# Patient Record
Sex: Female | Born: 1995 | Race: Black or African American | Hispanic: No | Marital: Single | Smoking: Current every day smoker
Health system: Southern US, Community
[De-identification: ages and names within clinical notes are randomized; demographics above are authoritative.]

## PROBLEM LIST (undated history)

## (undated) DIAGNOSIS — M419 Scoliosis, unspecified: Secondary | ICD-10-CM

## (undated) DIAGNOSIS — D571 Sickle-cell disease without crisis: Secondary | ICD-10-CM

## (undated) HISTORY — DX: Scoliosis, unspecified: M41.9

## (undated) HISTORY — DX: Sickle-cell disease without crisis: D57.1

## (undated) HISTORY — PX: WISDOM TOOTH EXTRACTION: SHX21

---
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
## 2016-06-19 ENCOUNTER — Ambulatory Visit (INDEPENDENT_AMBULATORY_CARE_PROVIDER_SITE_OTHER): Payer: Medicaid Other | Admitting: Family Medicine

## 2016-06-19 ENCOUNTER — Encounter: Payer: Self-pay | Admitting: Family Medicine

## 2016-06-19 VITALS — BP 102/60 | HR 70 | Temp 97.9°F | Resp 16 | Ht 67.5 in | Wt 100.8 lb

## 2016-06-19 DIAGNOSIS — R634 Abnormal weight loss: Secondary | ICD-10-CM | POA: Diagnosis not present

## 2016-06-19 DIAGNOSIS — Z720 Tobacco use: Secondary | ICD-10-CM

## 2016-06-19 DIAGNOSIS — N912 Amenorrhea, unspecified: Secondary | ICD-10-CM

## 2016-06-19 DIAGNOSIS — Z0000 Encounter for general adult medical examination without abnormal findings: Secondary | ICD-10-CM

## 2016-06-19 DIAGNOSIS — Z0001 Encounter for general adult medical examination with abnormal findings: Secondary | ICD-10-CM | POA: Diagnosis not present

## 2016-06-19 DIAGNOSIS — Z23 Encounter for immunization: Secondary | ICD-10-CM

## 2016-06-19 DIAGNOSIS — Z113 Encounter for screening for infections with a predominantly sexual mode of transmission: Secondary | ICD-10-CM | POA: Diagnosis not present

## 2016-06-19 LAB — COMPREHENSIVE METABOLIC PANEL
ALT: 9 U/L (ref 6–29)
AST: 18 U/L (ref 10–30)
Albumin: 4.2 g/dL (ref 3.6–5.1)
Alkaline Phosphatase: 60 U/L (ref 33–115)
BUN: 8 mg/dL (ref 7–25)
CHLORIDE: 106 mmol/L (ref 98–110)
CO2: 22 mmol/L (ref 20–31)
Calcium: 8.9 mg/dL (ref 8.6–10.2)
Creat: 0.74 mg/dL (ref 0.50–1.10)
Glucose, Bld: 91 mg/dL (ref 70–99)
POTASSIUM: 4.5 mmol/L (ref 3.5–5.3)
Sodium: 140 mmol/L (ref 135–146)
TOTAL PROTEIN: 6.5 g/dL (ref 6.1–8.1)
Total Bilirubin: 0.3 mg/dL (ref 0.2–1.2)

## 2016-06-19 LAB — CBC WITH DIFFERENTIAL/PLATELET
BASOS ABS: 0 {cells}/uL (ref 0–200)
Basophils Relative: 0 %
EOS ABS: 94 {cells}/uL (ref 15–500)
EOS PCT: 2 %
HCT: 37.2 % (ref 35.0–45.0)
Hemoglobin: 12.1 g/dL (ref 12.0–15.0)
LYMPHS PCT: 42 %
Lymphs Abs: 1974 cells/uL (ref 850–3900)
MCH: 27.5 pg (ref 27.0–33.0)
MCHC: 32.5 g/dL (ref 32.0–36.0)
MCV: 84.5 fL (ref 80.0–100.0)
MONOS PCT: 9 %
MPV: 9.3 fL (ref 7.5–12.5)
Monocytes Absolute: 423 cells/uL (ref 200–950)
NEUTROS PCT: 47 %
Neutro Abs: 2209 cells/uL (ref 1500–7800)
PLATELETS: 236 10*3/uL (ref 140–400)
RBC: 4.4 MIL/uL (ref 3.80–5.10)
RDW: 14.8 % (ref 11.0–15.0)
WBC: 4.7 10*3/uL (ref 3.8–10.8)

## 2016-06-19 LAB — URINALYSIS, ROUTINE W REFLEX MICROSCOPIC
BILIRUBIN URINE: NEGATIVE
Glucose, UA: NEGATIVE
Hgb urine dipstick: NEGATIVE
Ketones, ur: NEGATIVE
LEUKOCYTES UA: NEGATIVE
NITRITE: NEGATIVE
Protein, ur: NEGATIVE
SPECIFIC GRAVITY, URINE: 1.03 (ref 1.001–1.035)
pH: 6 (ref 5.0–8.0)

## 2016-06-19 LAB — PREGNANCY, URINE: PREG TEST UR: NEGATIVE

## 2016-06-19 NOTE — Patient Instructions (Signed)
F/U 4 weeks Planning to restart REMERON at bedtime for appetite

## 2016-06-19 NOTE — Addendum Note (Signed)
Addended by: Phillips OdorSIX, Maleke Feria H on: 06/19/2016 10:22 AM   Modules accepted: Orders

## 2016-06-19 NOTE — Progress Notes (Signed)
   Subjective:    Patient ID: Bridget Wolf, female    DOB: 07/28/1995, 21 y.o.   MRN: [id number]  Patient presents for CPE (is fasting)  Pt here for CPE, last seen in August 2016.  Sexual activity- Female partners Immunizations reviewed due for Gardisil/Men B Weight down 8lbs since last visit. States her appetite has decreased over the past couple months. She knows that she has been losing weight she's not had a regular menstrual cycle the past 2 months. She denies that she could be pregnant. She admits to some stress but overall feels like she is happy. 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 Sleeps well, but admits to smoking  Tobacco and marijauana in the evenings.  Denies abdominal pain, or change in bowels/urine pattern, no vaginal discharge, no nausea or pain with eating Often does not eat until 1pm, gets up late, and may or may not eat that night.     Review Of Systems:  GEN- denies fatigue, fever, +weight loss,weakness, recent illness HEENT- denies eye drainage, change in vision, nasal discharge, CVS- denies chest pain, palpitations RESP- denies SOB, cough, wheeze ABD- denies N/V, change in stools, abd pain GU- denies dysuria, hematuria, dribbling, incontinence MSK- denies joint pain, muscle aches, injury Neuro- denies headache, dizziness, syncope, seizure activity       Objective:    BP 102/60   Pulse 70   Temp 97.9 F (36.6 C) (Oral)   Resp 16   Ht 5' 7.5" (1.715 m)   Wt 100 lb 12.8 oz (45.7 kg)   LMP 05/03/2016 (Approximate) Comment: irregular  SpO2 99%   BMI 15.55 kg/m  GEN- NAD, alert and oriented x3, thin  HEENT- PERRL, EOMI, non injected sclera, pink conjunctiva, MMM, oropharynx clear Neck- Supple, no thyromegaly CVS- RRR, no murmur RESP-CTAB ABD-NABS,soft,NT,ND Psych- normal affect and mood  EXT- No edema Pulses- Radial, DP- 2+        Assessment & Plan:      Problem List Items Addressed This Visit    None    Visit Diagnoses    Routine general medical examination at a health care facility    -  Primary   CPE done, immunizations Men B and gardisil given. Check labs for metabolic cause of her weight loss. STD check done. If neg, will try remeron. May be more stress related that she may not be letting on. Also dicussed use of tobacco and marijuana Start ensure this weekend for morning supplement RTC in 4 weeks   Relevant Orders   CBC with Differential/Platelet   Comprehensive metabolic panel   Weight loss       Relevant Orders   TSH   Amenorrhea       Check U preg as child bearing age as well as FSH/LH   Relevant Orders   Pregnancy, urine   FSH/LH   Screen for STD (sexually transmitted disease)       Relevant Orders   GC/Chlamydia Probe Amp   Urinalysis, Routine w reflex microscopic   HIV antibody   HSV(herpes simplex vrs) 1+2 ab-IgG   Tobacco use          Note: This dictation was prepared with Dragon dictation along with smaller phrase technology. Any transcriptional errors that result from this process are unintentional.

## 2016-06-20 LAB — HIV ANTIBODY (ROUTINE TESTING W REFLEX): HIV: NONREACTIVE

## 2016-06-20 LAB — GC/CHLAMYDIA PROBE AMP
CT Probe RNA: NOT DETECTED
GC Probe RNA: NOT DETECTED

## 2016-06-20 LAB — FSH/LH
FSH: 3.5 m[IU]/mL
LH: 7.2 m[IU]/mL

## 2016-06-20 LAB — TSH: TSH: 0.88 mIU/L

## 2016-06-22 LAB — HSV(HERPES SIMPLEX VRS) I + II AB-IGG
HSV 1 Glycoprotein G Ab, IgG: <0.9 Index (ref <0.90)
HSV 2 Glycoprotein G Ab, IgG: <0.9 Index (ref <0.90)

## 2016-06-26 ENCOUNTER — Other Ambulatory Visit: Payer: Self-pay | Admitting: Family Medicine

## 2016-06-26 ENCOUNTER — Encounter: Payer: Self-pay | Admitting: Family Medicine

## 2016-06-26 MED ORDER — MIRTAZAPINE 7.5 MG PO TABS: 7.5000 mg | ORAL_TABLET | Freq: Every day | ORAL | 3 refills | Status: DC

## 2016-09-16 ENCOUNTER — Encounter (HOSPITAL_COMMUNITY): Payer: Self-pay | Admitting: Emergency Medicine

## 2016-09-16 ENCOUNTER — Emergency Department (HOSPITAL_COMMUNITY)
Admission: EM | Admit: 2016-09-16 | Discharge: 2016-09-16 | Disposition: A | Discharge status: Home / Self Care | Payer: Medicaid Other | Attending: Emergency Medicine | Admitting: Emergency Medicine

## 2016-09-16 DIAGNOSIS — Z79899 Other long term (current) drug therapy: Secondary | ICD-10-CM | POA: Diagnosis not present

## 2016-09-16 DIAGNOSIS — R42 Dizziness and giddiness: Secondary | ICD-10-CM | POA: Diagnosis not present

## 2016-09-16 DIAGNOSIS — R51 Headache: Secondary | ICD-10-CM | POA: Insufficient documentation

## 2016-09-16 DIAGNOSIS — F17200 Nicotine dependence, unspecified, uncomplicated: Secondary | ICD-10-CM | POA: Insufficient documentation

## 2016-09-16 DIAGNOSIS — R519 Headache, unspecified: Secondary | ICD-10-CM

## 2016-09-16 MED ORDER — IBUPROFEN 800 MG PO TABS
800.0000 mg | ORAL_TABLET | Freq: Once | ORAL | Status: AC
  Administered 2016-09-16: 800 mg via ORAL
  Filled 2016-09-16: qty 1

## 2016-09-16 MED ORDER — BUTALBITAL-APAP-CAFF-COD 50-325-40-30 MG PO CAPS: ORAL_CAPSULE | ORAL | 0 refills | Status: DC

## 2016-09-16 MED ORDER — PROMETHAZINE HCL 12.5 MG PO TABS
12.5000 mg | ORAL_TABLET | Freq: Once | ORAL | Status: AC
  Administered 2016-09-16: 12.5 mg via ORAL
  Filled 2016-09-16: qty 1

## 2016-09-16 MED ORDER — HYDROCODONE-ACETAMINOPHEN 5-325 MG PO TABS
1.0000 | ORAL_TABLET | Freq: Once | ORAL | Status: AC
  Administered 2016-09-16: 1 via ORAL
  Filled 2016-09-16: qty 1

## 2016-09-16 NOTE — Discharge Instructions (Signed)
Your vital signs are within normal limits. No acute neurologic deficit noted on tonight's exam. Use tylenol and/or ibuprofen for mild pain. Use Fioricet-codeine for more severe pain. This medication may cause drowsiness. Please do not drink, drive, or participate in activity that requires concentration while taking this medication. [facility] with Dr Neale BurlyFreeman and his team for formal evalluation of your headaches.

## 2016-09-16 NOTE — ED Triage Notes (Signed)
Pt c/o headache with dizziness since yesterday.

## 2016-09-16 NOTE — ED Provider Notes (Signed)
[facility]-EMERGENCY DEPT Provider Note   CSN: [id number] Arrival date & time: 09/16/16  2057     History   Chief Complaint Chief Complaint  Patient presents with  . Headache    HPI Bridget Wolf is a 21 y.o. female.   Headache   This is a new problem. The current episode started yesterday. The problem occurs every few hours. The problem has not changed since onset.The headache is associated with loud noise. The pain is located in the frontal region. The quality of the pain is described as throbbing. The pain is moderate. The pain does not radiate. Pertinent negatives include no fever, no chest pressure, no palpitations, no syncope, no shortness of breath, no nausea and no vomiting. Associated symptoms comments: dizziness. Treatments tried: ibuprofen.    Past Medical History:  Diagnosis Date  . Scoliosis   . Sickle cell anemia (HCC)     There are no active problems to display for this patient.   Past Surgical History:  Procedure Laterality Date  . WISDOM TOOTH EXTRACTION      OB History    No data available       Home Medications    Prior to Admission medications   Medication Sig Start Date End Date Taking? Authorizing Provider  mirtazapine (REMERON) 7.5 MG tablet Take 1 tablet (7.5 mg total) by mouth at bedtime. Patient taking differently: Take 7.5 mg by mouth at bedtime as needed.  06/26/16  Yes [place], Velna HatchetKawanta F, MD    Family History Family History  Problem Relation Age of Onset  . Depression Mother   . Hearing loss Mother   . Mental retardation Mother   . Asthma Sister   . Hearing loss Sister   . Vision loss Sister   . Hearing loss Maternal Grandmother   . Heart disease Maternal Grandmother   . Hyperlipidemia Maternal Grandmother   . Hypertension Maternal Grandmother   . Vision loss Maternal Grandmother   . Arthritis Maternal Grandfather     Social History Social History  Substance Use Topics  . Smoking status: Current Every Day Smoker  .  Smokeless tobacco: Never Used  . Alcohol use No     Comment: social     Allergies   Patient has no known allergies.   Review of Systems Review of Systems  Constitutional: Negative for activity change and fever.       All ROS Neg except as noted in HPI  HENT: Negative for congestion, nosebleeds and sore throat.   Eyes: Negative for photophobia and discharge.  Respiratory: Negative for cough, shortness of breath and wheezing.   Cardiovascular: Negative for chest pain, palpitations and syncope.  Gastrointestinal: Negative for abdominal pain, blood in stool, nausea and vomiting.  Genitourinary: Negative for dysuria, frequency and hematuria.  Musculoskeletal: Negative for arthralgias, back pain and neck pain.  Skin: Negative.   Neurological: Positive for dizziness and headaches. Negative for seizures and speech difficulty.  Psychiatric/Behavioral: Negative for confusion and hallucinations.     Physical Exam Updated Vital Signs BP 115/76 (BP Location: Right Arm)   Pulse 87   Temp 98.7 F (37.1 C) (Oral)   Resp 16   Ht 5\' 10"  (1.778 m)   Wt 46.7 kg (103 lb)   LMP 07/23/2016   SpO2 99%   BMI 14.78 kg/m   Physical Exam  Constitutional: She is oriented to person, place, and time. She appears well-developed and well-nourished.  Non-toxic appearance. No distress.  HENT:  Head: Normocephalic and atraumatic.  Right Ear: Tympanic membrane and external ear normal.  Left Ear: Tympanic membrane and external ear normal.  Eyes: Pupils are equal, round, and reactive to light. Conjunctivae, EOM and lids are normal. Right eye exhibits no discharge. Left eye exhibits no discharge. No scleral icterus.  Neck: Normal range of motion. Neck supple. Carotid bruit is not present. No tracheal deviation present.  Cardiovascular: Normal rate, regular rhythm, intact distal pulses and normal pulses.   Murmur heard.  Systolic murmur is present with a grade of 2/6  Pulmonary/Chest: Effort normal and  breath sounds normal. No stridor. No respiratory distress. She has no wheezes. She has no rales.  Abdominal: Soft. Bowel sounds are normal. She exhibits no distension. There is no tenderness. There is no rebound and no guarding.  Musculoskeletal: Normal range of motion. She exhibits no edema or tenderness.  Lymphadenopathy:       Head (right side): No submandibular adenopathy present.       Head (left side): No submandibular adenopathy present.    She has no cervical adenopathy.  Neurological: She is alert and oriented to person, place, and time. She has normal strength. No cranial nerve deficit or sensory deficit. She exhibits normal muscle tone. She displays no seizure activity. Coordination normal.  Skin: Skin is warm and dry. No rash noted.  Psychiatric: She has a normal mood and affect. Her speech is normal.  Nursing note and vitals reviewed.    ED Treatments / Results  Labs (all labs ordered are listed, but only abnormal results are displayed) Labs Reviewed - No data to display  EKG  EKG Interpretation None       Radiology No results found.  Procedures Procedures (including critical care time)  Medications Ordered in ED Medications - No data to display   Initial Impression / Assessment and Plan / ED Course  I have reviewed the triage vital signs and the nursing notes.  Pertinent labs & imaging results that were available during my care of the patient were reviewed by me and considered in my medical decision making (see chart for details).       Final Clinical Impressions(s) / ED Diagnoses MDM Vital signs stable. No gross neuro deficits noted. Gait steady. No dizziness today, only yesterday. No hx of injury or trauma. No recent diet changes to be reported. Pt will be treated with Fiorinal-codeine for headache not improved by ibuprofen. [facility] [place]. Pt in agreement with this plan.   Final diagnoses:  Nonintractable  headache, unspecified chronicity pattern, unspecified headache type    New Prescriptions New Prescriptions   BUTALBITAL-ACETAMINOPHEN-CAFFEINE (FIORICET/CODEINE) 50-325-40-30 MG CAPSULE    1 or 2 po q6h prn headache. Take with food.     Ivery Quale, PA-C 09/16/16 2247    Mesner, Barbara Cower, MD 09/16/16 [phone]

## 2016-09-29 ENCOUNTER — Encounter: Payer: Self-pay | Admitting: Family Medicine

## 2016-10-27 ENCOUNTER — Encounter: Payer: Self-pay | Admitting: Family Medicine

## 2016-11-03 ENCOUNTER — Encounter (HOSPITAL_COMMUNITY): Payer: Self-pay | Admitting: *Deleted

## 2016-11-03 ENCOUNTER — Emergency Department (HOSPITAL_COMMUNITY)
Admission: EM | Admit: 2016-11-03 | Discharge: 2016-11-04 | Disposition: A | Discharge status: Home / Self Care | Payer: Self-pay | Attending: Emergency Medicine | Admitting: Emergency Medicine

## 2016-11-03 DIAGNOSIS — F320 Major depressive disorder, single episode, mild: Secondary | ICD-10-CM

## 2016-11-03 DIAGNOSIS — F17210 Nicotine dependence, cigarettes, uncomplicated: Secondary | ICD-10-CM | POA: Insufficient documentation

## 2016-11-03 DIAGNOSIS — F329 Major depressive disorder, single episode, unspecified: Secondary | ICD-10-CM | POA: Insufficient documentation

## 2016-11-03 NOTE — ED Notes (Signed)
Pt belongings searched and secured in locker. Pt changed to paper scrubs and warm blanket given.

## 2016-11-03 NOTE — ED Triage Notes (Signed)
Pt states she has been feeling depressed for the last couple of months; pt states she has been stressed with work and family; pt states she has thoughts of suicide but does not have a specific plan

## 2016-11-03 NOTE — ED Notes (Signed)
Informed pt of urine specimen needed. Pt says she is unable to provide at this time.

## 2016-11-04 LAB — BASIC METABOLIC PANEL
ANION GAP: 9 (ref 5–15)
BUN: 11 mg/dL (ref 6–20)
CALCIUM: 9.1 mg/dL (ref 8.9–10.3)
CO2: 26 mmol/L (ref 22–32)
Chloride: 103 mmol/L (ref 101–111)
Creatinine, Ser: 0.78 mg/dL (ref 0.44–1.00)
Glucose, Bld: 93 mg/dL (ref 65–99)
Potassium: 4 mmol/L (ref 3.5–5.1)
SODIUM: 138 mmol/L (ref 135–145)

## 2016-11-04 LAB — CBC WITH DIFFERENTIAL/PLATELET
BASOS ABS: 0 10*3/uL (ref 0.0–0.1)
BASOS PCT: 0 %
Eosinophils Absolute: 0.1 10*3/uL (ref 0.0–0.7)
Eosinophils Relative: 1 %
HEMATOCRIT: 38.6 % (ref 36.0–46.0)
Hemoglobin: 13.3 g/dL (ref 12.0–15.0)
Lymphocytes Relative: 44 %
Lymphs Abs: 3.2 10*3/uL (ref 0.7–4.0)
MCH: 28 pg (ref 26.0–34.0)
MCHC: 34.5 g/dL (ref 30.0–36.0)
MCV: 81.3 fL (ref 78.0–100.0)
MONO ABS: 0.3 10*3/uL (ref 0.1–1.0)
Monocytes Relative: 5 %
NEUTROS ABS: 3.5 10*3/uL (ref 1.7–7.7)
Neutrophils Relative %: 50 %
PLATELETS: 224 10*3/uL (ref 150–400)
RBC: 4.75 MIL/uL (ref 3.87–5.11)
RDW: 13.7 % (ref 11.5–15.5)
WBC: 7.1 10*3/uL (ref 4.0–10.5)

## 2016-11-04 LAB — ETHANOL

## 2016-11-04 NOTE — ED Provider Notes (Signed)
[facility] EMERGENCY DEPARTMENT Provider Note   CSN: [id number] Arrival date & time: 11/03/16  2314     History   Chief Complaint Chief Complaint  Patient presents with  . V70.1    HPI Christy SartoriusDaeshawna G Karg is a 21 y.o. female.  The history is provided by the patient.  Mental Health Problem  Presenting symptoms: depression   Presenting symptoms: no suicidal thoughts, no suicidal threats and no suicide attempt   Degree of incapacity (severity):  Moderate Onset quality:  Gradual Duration: several months. Timing:  Constant Progression:  Worsening Chronicity:  Recurrent Relieved by:  Nothing Worsened by:  Nothing Associated symptoms: no abdominal pain, no chest pain and no headaches   pt reports feeling depressed for several months, it is worsening She has been stressed with work and family She had thoughts of suicide previously but none at this time She has never attempted suicide She has no plans for suicide Sh has no access to weapons She used antidepressants once for a month but it did not help  Past Medical History:  Diagnosis Date  . Scoliosis   . Sickle cell anemia (HCC)     There are no active problems to display for this patient.   Past Surgical History:  Procedure Laterality Date  . WISDOM TOOTH EXTRACTION      OB History    No data available       Home Medications    Prior to Admission medications   Not on File    Family History Family History  Problem Relation Age of Onset  . Depression Mother   . Hearing loss Mother   . Mental retardation Mother   . Asthma Sister   . Hearing loss Sister   . Vision loss Sister   . Hearing loss Maternal Grandmother   . Heart disease Maternal Grandmother   . Hyperlipidemia Maternal Grandmother   . Hypertension Maternal Grandmother   . Vision loss Maternal Grandmother   . Arthritis Maternal Grandfather     Social History Social History  Substance Use Topics  . Smoking status: Current Every Day  Smoker    Packs/day: 0.50  . Smokeless tobacco: Never Used  . Alcohol use No     Comment: social     Allergies   Patient has no known allergies.   Review of Systems Review of Systems  Constitutional: Negative for fever.  Cardiovascular: Negative for chest pain.  Gastrointestinal: Negative for abdominal pain.  Neurological: Negative for headaches.  Psychiatric/Behavioral: Negative for suicidal ideas.  All other systems reviewed and are negative.    Physical Exam Updated Vital Signs BP 112/72 (BP Location: Right Arm)   Pulse 83   Temp 98.6 F (37 C) (Oral)   Resp 18   Ht 1.778 m (5\' 10" )   Wt 46.3 kg (102 lb)   SpO2 100%   BMI 14.64 kg/m   Physical Exam CONSTITUTIONAL: Well developed/well nourished HEAD: Normocephalic/atraumatic EYES: EOMI ENMT: Mucous membranes moist NECK: supple no meningeal signs CV: S1/S2 noted, no murmurs/rubs/gallops noted LUNGS: Lungs are clear to auscultation bilaterally, no apparent distress ABDOMEN: soft, nontender, no rebound or guarding, bowel sounds noted throughout abdomen GU:no cva tenderness NEURO: Pt is awake/alert/appropriate, moves all extremitiesx4.   EXTREMITIES:   full ROM SKIN: warm, color normal PSYCH: flat affect   ED Treatments / Results  Labs (all labs ordered are listed, but only abnormal results are displayed) Labs Reviewed  CBC WITH DIFFERENTIAL/PLATELET  BASIC METABOLIC PANEL  ETHANOL  EKG  EKG Interpretation None       Radiology No results found.  Procedures Procedures (including critical care time)  Medications Ordered in ED Medications - No data to display   Initial Impression / Assessment and Plan / ED Course  I have reviewed the triage vital signs and the nursing notes.  Pertinent labs   results that were available during my care of the patient were reviewed by me and considered in my medical decision making (see chart for details).     Pt in the ED for ongoing depression, but no  active SI at this time She has no access to weapons She feels safe at home She does not appear to be a threat to herself Seen by psych/TTS From their standpoint, it was felt she was appropriate for discharge but they advised monitoring overnight Pt prefers to be discharged Outpatient resources given We discussed strict ER return precautions   Final Clinical Impressions(s) / ED Diagnoses   Final diagnoses:  Current mild episode of major depressive disorder without prior episode [facility])    New Prescriptions New Prescriptions   No medications on file     Zadie Rhine, MD 11/04/16 [phone]

## 2016-11-04 NOTE — BH Assessment (Addendum)
Tele Assessment Note   Patient Name: Bridget Wolf MRN: [id number] Referring Physician: DR Bebe Shaggy Location of Patient: [facility] Location of Provider: Behavioral Health TTS Department  Bridget Wolf is an 21 y.o. female who came to the [facility] tonight voluntarily c/o depression symptoms and passive SI. Pt denies any plan for suicide, HI, SHI and AVH. Pt sts she has not had SI before today and today she was having thoughts of "it would be better if a were not here." Pt sts she has no hx of suicide attempts. Pt sts she has been feeling depressed for the last 2-3 months and has been prescribed an anti-depressant by her family doctor at [facility].  Pt has no previous hx of mental health treatment and has never been psychiatrically hospitalized. Pt has no current psychiatrist or OP therapist.   Pt sts she lives with her grandfather, sister and her sisters children. Pt sts that there is "s lot of arguing" and she sites this as a major stressor. Pt sts she graduated high school in 2016 and currently works at [employer]. Pt denies any hx of legal issues. Pt denies any access to guns or weapons. Pt denies any hx of abuse. Pt sts she works 3rd shift and sleeps about 6 hours during the daytime. Pt sts she has had decreased appetite recently and appears underweight. Pt denies any restricting of food, overuse of laxatives or excessive exercise or any hx of an eating disorder. Pt's symptoms of depression including sadness, fatigue, tearfulness / crying spells, some self isolation, some lack of motivation for activities and pleasure,some irritability, and some feelings helpless and hopeless at times. Pt denies symptoms of anxiety but sts at times she worries too much about other people she cares about. Pt sts she has recently lost a long-term friendship which she sts may have contributed to her depression.   Pt was dressed in scrubs and sitting on her hospital bed. Pt was alert, cooperative  and polite. Pt kept good eye contact, spoke in a clear tone and at a normal pace. Pt moved in a normal manner when moving. Pt's thought process was coherent and relevant and judgement was not significantly impaired.  No indication of delusional thinking or response to internal stimuli. Pt's mood was stated as depressed but not anxious and her blunted affect was congruent.  Pt was oriented x 4, to person, place, time and situation.   Diagnosis: MDD, Single Episode, Moderate  Past Medical History:  Past Medical History:  Diagnosis Date  . Scoliosis   . Sickle cell anemia (HCC)     Past Surgical History:  Procedure Laterality Date  . WISDOM TOOTH EXTRACTION      Family History:  Family History  Problem Relation Age of Onset  . Depression Mother   . Hearing loss Mother   . Mental retardation Mother   . Asthma Sister   . Hearing loss Sister   . Vision loss Sister   . Hearing loss Maternal Grandmother   . Heart disease Maternal Grandmother   . Hyperlipidemia Maternal Grandmother   . Hypertension Maternal Grandmother   . Vision loss Maternal Grandmother   . Arthritis Maternal Grandfather     Social History:  reports that she has been smoking.  She has been smoking about 0.50 packs per day. She has never used smokeless tobacco. She reports that she does not drink alcohol or use drugs.  Additional Social History:  Alcohol / Drug Use Prescriptions: SEE MAR  History of alcohol / drug use?: Yes Longest period of sobriety (when/how long): UNKNOWN Substance #1 Name of Substance 1: CANNABIS 1 - Age of First Use: 17 1 - Amount (size/oz): 1 BLUNT 1 - Frequency: DAILY IF POSSIBLE 1 - Duration: ONGOING 1 - Last Use / Amount: 2 DAYS AGO Substance #2 Name of Substance 2: NICOTINE/CIGARETTES 2 - Age of First Use: 18 2 - Amount (size/oz): 1/2 PACK 2 - Frequency: DAILY 2 - Duration: ONGOING 2 - Last Use / Amount: 11/03/16  CIWA: CIWA-Ar BP: 112/72 Pulse Rate: 83 COWS:    PATIENT  STRENGTHS: (choose at least two) Average or above average intelligence Communication skills Supportive family/friends  Allergies: No Known Allergies  Home Medications:  (Not in a hospital admission)  OB/GYN Status:  No LMP recorded. Patient is not currently having periods (Reason: Irregular Periods).  General Assessment Data TTS Assessment: In system Is this a Tele or Face-to-Face Assessment?: Tele Assessment Is this an Initial Assessment or a Re-assessment for this encounter?: Initial Assessment Marital status: Single Is patient pregnant?: Unknown Pregnancy Status: Unknown Living Arrangements: Other relatives (GF, SISTER & HER CHILDREN) Can pt return to current living arrangement?: Yes Admission Status: Voluntary Is patient capable of signing voluntary admission?: Yes Referral Source: Self/Family/Friend Insurance type: SELF PAY     Crisis Care Plan Living Arrangements: Other relatives (GF, SISTER & HER CHILDREN) Name of Psychiatrist: NONE Name of Therapist: NONE  Education Status Is patient currently in school?: No Highest grade of school patient has completed: HS GRADUATE  Risk to self with the past 6 months Suicidal Ideation: Yes-Currently Present (THOUGHTS OF "BETTER IF I'M NOT HERE") Has patient been a risk to self within the past 6 months prior to admission? : No Suicidal Intent: No Has patient had any suicidal intent within the past 6 months prior to admission? : No Is patient at risk for suicide?: No Suicidal Plan?: No Has patient had any suicidal plan within the past 6 months prior to admission? : No Access to Means: No (STS NO ACCESS TO GUNS, WEAPONS) What has been your use of drugs/alcohol within the last 12 months?: REGULAR USE Previous Attempts/Gestures: No How many times?: 0 Other Self Harm Risks: NONE REPORTED Triggers for Past Attempts: None known Intentional Self Injurious Behavior: None Family Suicide History: No Recent stressful life event(s):  Conflict (Comment) (ARGUING WITH FAMILY) Persecutory voices/beliefs?: No Depression: Yes Depression Symptoms: Tearfulness, Isolating, Loss of interest in usual pleasures, Feeling angry/irritable (SOME HOPELESSNESS) Substance abuse history and/or treatment for substance abuse?: Yes (USE) Suicide prevention information given to non-admitted patients: Not applicable  Risk to Others within the past 6 months Homicidal Ideation: No Does patient have any lifetime risk of violence toward others beyond the six months prior to admission? : No Thoughts of Harm to Others: No Current Homicidal Intent: No Current Homicidal Plan: No Access to Homicidal Means: No Identified Victim: NONE History of harm to others?: No (DENIES) Assessment of Violence: None Noted Does patient have access to weapons?: No Criminal Charges Pending?: No Does patient have a court date: No Is patient on probation?: No  Psychosis Hallucinations: None noted Delusions: None noted  Mental Status Report Appearance/Hygiene: Disheveled, In scrubs (APPEARS UNDERWEIGHT) Eye Contact: Good Motor Activity: Freedom of movement Speech: Logical/coherent Level of Consciousness: Alert Mood: Depressed, Pleasant Affect: Blunted, Depressed Anxiety Level: None Thought Processes: Coherent, Relevant Judgement: Partial Orientation: Person, Place, Time, Situation Obsessive Compulsive Thoughts/Behaviors: None  Cognitive Functioning Concentration: Good Memory: Recent Intact, Remote Intact IQ:  Average Insight: Good Impulse Control: Good Appetite: Fair Weight Loss: 0 Weight Gain: 0 Sleep: No Change Total Hours of Sleep: 6 (WORKS 3RD SHIFT-SLEEPS DURING THE DAY) Vegetative Symptoms: None  ADLScreening [facility] Assessment Services) Patient's cognitive ability adequate to safely complete daily activities?: Yes Patient able to express need for assistance with ADLs?: Yes Independently performs ADLs?: Yes (appropriate for developmental  age)  Prior Inpatient Therapy Prior Inpatient Therapy: No  Prior Outpatient Therapy Prior Outpatient Therapy: No Does patient have an ACCT team?: No Does patient have Intensive In-House Services?  : No Does patient have [facility] services? : No Does patient have P4CC services?: No  ADL Screening (condition at time of admission) Patient's cognitive ability adequate to safely complete daily activities?: Yes Patient able to express need for assistance with ADLs?: Yes Independently performs ADLs?: Yes (appropriate for developmental age)       Abuse/Neglect Assessment (Assessment to be complete while patient is alone) Physical Abuse: Denies Verbal Abuse: Denies Sexual Abuse: Denies Exploitation of patient/patient's resources: Denies Self-Neglect: Denies     Merchant navy officerAdvance Directives (For Healthcare) Does Patient Have a Medical Advance Directive?: No Would patient like information on creating a medical advance directive?: No - Patient declined    Additional Information 1:1 In Past 12 Months?: No CIRT Risk: No Elopement Risk: No Does patient have medical clearance?: Yes     Disposition:  Disposition Initial Assessment Completed for this Encounter: Yes Disposition of Patient: Other dispositions Other disposition(s): Other (Comment) (PENDING REVIEW W BHH EXTENDER)  This service was provided via telemedicine using a 2-way, interactive audio and video technology.  Names of all persons participating in this telemedicine service and their role in this encounter. Name: Myles Gipaeshawna Silveira Role: Patient  Name:  Role:   Name:  Role:   Name:  Role:    Per Donell SievertSpencer Simon, PA recommend overnight observation for safety & stability with probable discharge during daytime with OP resources for immediate follow-up.  Spoke with Dr. Bebe ShaggyWickline and advised of recommendation.   Beryle FlockMary Mitchell Iwanicki, MS, CRC, [facility] Triage Specialist [facility] T 11/04/2016 2:25 AM

## 2016-11-04 NOTE — Discharge Instructions (Signed)
Substance Abuse Treatment Programs ° °Intensive Outpatient Programs °[facility]     °[address]      °[place], [place]                   °[phone]      ° °[facility] °[address] °[place], [place] °[phone] ° °[facility]     °(Inpatient and outpatient)     °[address]           °[phone]   ° °[facility] °[phone] (Suboxone and Methadone) ° °[address]      °[place], [place] [address]      °336-882-2125      ° °[address] °[place], [place] °852-3033 ° °[facility] (Outpatient/Inpatient, [facility])    °(insurance only) [phone]      °       °[facility] (Groups & Residential) °[place], [place] °[phone] ° °   °[facility]     °[address]     °[place], [place]      °[phone]      ° °[facility] (for caregivers and family) °[address] °[place], [place] °336-299-4655 ° ° ° ° ° °Residential Treatment Programs °[facility]      °[address], [place], [place] [address]  °([phone]      ° °[facility] °[address] [place], [place] [address] °[phone] ° °[facility]        °336-248-8914      ° °[facility] °1-800-659-3381 ° °[facility]             °[address]                                         °[place], [place]                                                °877-615-2722 or 336-784-9470                              ° °[facility] [place] °[address] °[place], [address] °[phone] ° °[facility]    °[address]      °[place], [place]     °[phone]      ° °[facility] °[address] °, [place] °336-285-9073 ° °[facility]   °[address]     °[place], [place] [address]     °[phone]      °Admissions: 8am-3pm M-F ° °[facility] °[address] °[place],  [place] °336-227-7417 ° °[facility]: Residential Program (90 Days)   °[place], [place]      °336-725-8389 or 800-758-6077    ° °[facility] °[place], [place] °(Walk in Hours over the weekend or by referral) ° °[facility] °[address], [place],  [address] °([phone] ° °Crisis Mobile: [facility]:  1-877-626-1772 (for crisis response 24 hours a day) °[facility] Hotline:      1-800-256-2452 °Outpatient Psychiatry and Counseling ° °[facility]: Mobile Crisis   Management 24 hours:  1-877-626-1772 ° °[facility] sliding scale fee and walk in schedule: M-F 8am-12pm/1pm-3pm °[address]  °[place], [place] [address] °[phone] ° °[facility] °[address] °[place], [place] [address] °336-703-9650 ° °[facility] ([facility]ly known [facility])- new patient walk-in appointments available Monday - Friday 8am -3pm.          °[address] °[place], [place] [address] °336-676-6840 or crisis line- 336-676-6905 ° °[facility] Intensive Outpatient Therapy Program °[address] °[place], [place] [address] °336-832-9804 ° °[facility]                  °Crisis Services      °[phone]      °[address]     °[place], [place] [address]                ° °[facility]   °[facility] °[phone] °[address] °[place], [place] [address] ° ° °[facility]          °[address],  °[place], [place] [address]       °([phone] ° °[facility] °[address] °[place], [place] [address] °336-292-1510 ° °[facility]    °[address]    °[place], [place] [address]     °[phone]      ° °Parish McKinney, MD     °[address]     °[place] [address]     °336-282-1251     °  °[facility] °[address] °[place] [address] ° °[facility]     °[address]     °[place], [place]      °336-542-2076      ° °[facility] °Shamsher Ahluwalia, MD °[address] °[place], [place] [address] °[phone] ° °[facility]     °[address]     °[place], [place] [address]     °336-274-1237      ° °[facility] °[address] °[place], [place] [address] °336-854-4450 °Resources for Temporary Residential Assistance/Crisis Centers ° °DAY CENTERS °[facility] °M-F 8am-3pm   °[address] [place], [place] [address]   336-332-0824 °Services include: laundry, barbering, support groups, case management, phone  & computer access, showers, AA/NA mtgs, mental health/substance abuse nurse, job skills class, disability information, VA assistance, spiritual classes, etc.  ° °HOMELESS SHELTERS ° °[facility]     °[facility]   °[address], [place]     °[phone]       °       °[facility] (women and children)       °[address] °[place], [place] [address] °336-275-0820 °[email or web address] for application and process °Application Required ° °[facility]   °[address]    °[place] [address]     °[phone]       °             °[facility] °[address] °[place],  [address] °336.273.5572 °336-235-0363(schedule application appt.) °Application Required ° °[facility] (women only)    °[address]     °[place],  [address]     °336-884-1039      °  Intake starts 6pm daily °Need valid ID, SSC, & Police report °[facility] [place] °[address] °[place], [place] °336-881-5420 °Application Required ° °[facility] (men only)     °[address]      °[place], [place]     °[phone]      ° °[facility] °(Pregnant women only) °[address] °[place], [place] °336-275-0206 ° °[facility]      °[address]      °[place], [place] [address]     °336-722-9951      °       °[facility] [place] [facility] °[address] °[place], [place] °336-723-1848 °90 day commitment/SA/Application process ° °[facility](men only)     °[address]     °[place], [place]     °[phone]       °Check-in at 7pm     °       °[facility] °[address] °[place], [place] [address] °336-248-6684 °Men/Women/Women and Children must be there by 7 pm ° °[facility] °[place], [place] °336-722-8721                ° °

## 2016-11-10 ENCOUNTER — Ambulatory Visit: Payer: Self-pay | Admitting: Family Medicine

## 2016-11-10 NOTE — Progress Notes (Deleted)
   Subjective:    Patient ID: Bridget Wolf, female    DOB: 08-Jul-1995, 21 y.o.   MRN: [id number]  Patient presents for No chief complaint on file.  Here for ER follow-up.  She was seen in the ER after feelings of depression for the past 3 months.  She also had passive suicidal ideation but no plan.  She was assessed by behavioral health who felt that she had moderate depression.  She was not started on any new medication.  She is advised to follow-up in the office for further treatment and community resources.  Chart notes state that she has had decreased appetite crying spells depressed mood in the setting of losing a relationship with a close friend.   Review Of Systems:  GEN- denies fatigue, fever, weight loss,weakness, recent illness HEENT- denies eye drainage, change in vision, nasal discharge, CVS- denies chest pain, palpitations RESP- denies SOB, cough, wheeze ABD- denies N/V, change in stools, abd pain GU- denies dysuria, hematuria, dribbling, incontinence MSK- denies joint pain, muscle aches, injury Neuro- denies headache, dizziness, syncope, seizure activity       Objective:    There were no vitals taken for this visit. GEN- NAD, alert and oriented x3         Assessment & Plan:      Problem List Items Addressed This Visit    None      Note: This dictation was prepared with Dragon dictation along with smaller phrase technology. Any transcriptional errors that result from this process are unintentional.

## 2017-05-28 ENCOUNTER — Other Ambulatory Visit: Payer: Self-pay

## 2017-05-28 ENCOUNTER — Emergency Department (HOSPITAL_COMMUNITY)
Admission: EM | Admit: 2017-05-28 | Discharge: 2017-05-28 | Disposition: A | Discharge status: Home / Self Care | Payer: Self-pay | Attending: Emergency Medicine | Admitting: Emergency Medicine

## 2017-05-28 ENCOUNTER — Encounter (HOSPITAL_COMMUNITY): Payer: Self-pay | Admitting: Emergency Medicine

## 2017-05-28 DIAGNOSIS — F17200 Nicotine dependence, unspecified, uncomplicated: Secondary | ICD-10-CM | POA: Insufficient documentation

## 2017-05-28 DIAGNOSIS — M654 Radial styloid tenosynovitis [de Quervain]: Secondary | ICD-10-CM | POA: Insufficient documentation

## 2017-05-28 MED ORDER — IBUPROFEN 800 MG PO TABS
800.0000 mg | ORAL_TABLET | Freq: Once | ORAL | Status: AC
Start: 2017-05-28 — End: 2017-05-28
  Administered 2017-05-28: 800 mg via ORAL
  Filled 2017-05-28: qty 1

## 2017-05-28 MED ORDER — IBUPROFEN 800 MG PO TABS: 800.0000 mg | ORAL_TABLET | Freq: Three times a day (TID) | ORAL | 0 refills | Status: DC

## 2017-05-28 NOTE — ED Triage Notes (Signed)
Pt c/o right wrist pain, denies injury or previous injury

## 2017-05-28 NOTE — ED Provider Notes (Signed)
[facility] EMERGENCY DEPARTMENT Provider Note   CSN: [id number] Arrival date & time: 05/28/17  4098     History   Chief Complaint Chief Complaint  Patient presents with  . Wrist Pain    right    HPI Bridget Wolf is a 22 y.o. female.  Patient complaining of right wrist pain.  She denies any injury.  Pain started earlier today.  Pain is at the base of the thumb area and worsens with movement of the wrist.  No redness or swelling.     Past Medical History:  Diagnosis Date  . Scoliosis   . Sickle cell anemia (HCC)     There are no active problems to display for this patient.   Past Surgical History:  Procedure Laterality Date  . WISDOM TOOTH EXTRACTION       OB History   None      Home Medications    Prior to Admission medications   Medication Sig Start Date End Date Taking? Authorizing Provider  ibuprofen (ADVIL,MOTRIN) 800 MG tablet Take 1 tablet (800 mg total) by mouth 3 (three) times daily. 05/28/17   Gilda Crease, MD    Family History Family History  Problem Relation Age of Onset  . Depression Mother   . Hearing loss Mother   . Mental retardation Mother   . Asthma Sister   . Hearing loss Sister   . Vision loss Sister   . Hearing loss Maternal Grandmother   . Heart disease Maternal Grandmother   . Hyperlipidemia Maternal Grandmother   . Hypertension Maternal Grandmother   . Vision loss Maternal Grandmother   . Arthritis Maternal Grandfather     Social History Social History   Tobacco Use  . Smoking status: Current Every Day Smoker    Packs/day: 0.50  . Smokeless tobacco: Never Used  Substance Use Topics  . Alcohol use: No    Alcohol/week: 0.0 oz    Comment: social  . Drug use: No    Types: Marijuana    Comment: h/o marijuana     Allergies   Patient has no known allergies.   Review of Systems Review of Systems  Musculoskeletal: Positive for arthralgias.  All other systems reviewed and are  negative.    Physical Exam Updated Vital Signs BP 113/70 (BP Location: Right Arm)   Pulse 87   Temp 99.1 F (37.3 C) (Oral)   Resp 16   Ht  (1.778 m)   Wt 49 kg (108 lb)   SpO2 98%   BMI 15.50 kg/m   Physical Exam  Constitutional: She is oriented to person, place, and time. She appears well-developed and well-nourished. No distress.  HENT:  Head: Normocephalic and atraumatic.  Right Ear: Hearing normal.  Left Ear: Hearing normal.  Nose: Nose normal.  Mouth/Throat: Oropharynx is clear and moist and mucous membranes are normal.  Eyes: Pupils are equal, round, and reactive to light. Conjunctivae and EOM are normal.  Neck: Normal range of motion. Neck supple.  Cardiovascular: Regular rhythm, S1 normal and S2 normal. Exam reveals no gallop and no friction rub.  No murmur heard. Pulmonary/Chest: Effort normal and breath sounds normal. No respiratory distress. She exhibits no tenderness.  Abdominal: Soft. Normal appearance and bowel sounds are normal. There is no hepatosplenomegaly. There is no tenderness. There is no rebound, no guarding, no tenderness at McBurney's point and negative Murphy's sign. No hernia.  Musculoskeletal:       Right wrist: She exhibits decreased range  of motion and tenderness (radial). She exhibits no swelling, no effusion and no deformity.  VERY positive Finkelstein Maneuver  Neurological: She is alert and oriented to person, place, and time. She has normal strength. No cranial nerve deficit or sensory deficit. Coordination normal. GCS eye subscore is 4. GCS verbal subscore is 5. GCS motor subscore is 6.  Skin: Skin is warm, dry and intact. No rash noted. No cyanosis.  Psychiatric: She has a normal mood and affect. Her speech is normal and behavior is normal. Thought content normal.  Nursing note and vitals reviewed.    ED Treatments / Results  Labs (all labs ordered are listed, but only abnormal results are displayed) Labs Reviewed - No data to  display  EKG None  Radiology No results found.  Procedures Procedures (including critical care time)  Medications Ordered in ED Medications  ibuprofen (ADVIL,MOTRIN) tablet 800 mg (has no administration in time range)     Initial Impression / Assessment and Plan / ED Course  I have reviewed the triage vital signs and the nursing notes.  Pertinent labs & imaging results that were available during my care of the patient were reviewed by me and considered in my medical decision making (see chart for details).     Patient with nontraumatic right wrist pain.  There is no swelling, redness, warmth, no concern for septic joint.  Does not require imaging.  Patient has mild tenderness at the radial aspect of the wrist but severe pain with Finkelstein maneuver, consistent with a tendinitis.  Final Clinical Impressions(s) / ED Diagnoses   Final diagnoses:  De Quervain's tenosynovitis, right    ED Discharge Orders        Ordered    ibuprofen (ADVIL,MOTRIN) 800 MG tablet  3 times daily     05/28/17 0252       Gilda Crease, MD 05/28/17 [phone]

## 2017-12-14 ENCOUNTER — Encounter (HOSPITAL_COMMUNITY): Payer: Self-pay | Admitting: Emergency Medicine

## 2017-12-14 ENCOUNTER — Emergency Department (HOSPITAL_COMMUNITY): Payer: No Typology Code available for payment source

## 2017-12-14 ENCOUNTER — Emergency Department (HOSPITAL_COMMUNITY)
Admission: EM | Admit: 2017-12-14 | Discharge: 2017-12-15 | Disposition: A | Discharge status: Home / Self Care | Payer: No Typology Code available for payment source | Attending: Emergency Medicine | Admitting: Emergency Medicine

## 2017-12-14 ENCOUNTER — Other Ambulatory Visit: Payer: Self-pay

## 2017-12-14 DIAGNOSIS — Y92410 Unspecified street and highway as the place of occurrence of the external cause: Secondary | ICD-10-CM | POA: Diagnosis not present

## 2017-12-14 DIAGNOSIS — Y999 Unspecified external cause status: Secondary | ICD-10-CM | POA: Insufficient documentation

## 2017-12-14 DIAGNOSIS — S199XXA Unspecified injury of neck, initial encounter: Secondary | ICD-10-CM | POA: Diagnosis present

## 2017-12-14 DIAGNOSIS — F17210 Nicotine dependence, cigarettes, uncomplicated: Secondary | ICD-10-CM | POA: Insufficient documentation

## 2017-12-14 DIAGNOSIS — Y939 Activity, unspecified: Secondary | ICD-10-CM | POA: Diagnosis not present

## 2017-12-14 DIAGNOSIS — S161XXA Strain of muscle, fascia and tendon at neck level, initial encounter: Secondary | ICD-10-CM | POA: Insufficient documentation

## 2017-12-14 DIAGNOSIS — R51 Headache: Secondary | ICD-10-CM | POA: Diagnosis not present

## 2017-12-14 MED ORDER — IBUPROFEN 400 MG PO TABS
400.0000 mg | ORAL_TABLET | Freq: Once | ORAL | Status: AC
  Administered 2017-12-14: 400 mg via ORAL
  Filled 2017-12-14: qty 1
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
## 2017-12-15 MED ORDER — IBUPROFEN 600 MG PO TABS: 600.0000 mg | ORAL_TABLET | Freq: Four times a day (QID) | ORAL | 0 refills | Status: DC | PRN

## 2017-12-15 NOTE — Discharge Instructions (Addendum)
Your x-rays are negative.  Take the anti-inflammatories as prescribed and follow-up with your doctor.  Return to the ED with worsening pain, numbness, tingling, any other concerns.

## 2018-02-19 ENCOUNTER — Other Ambulatory Visit: Payer: Self-pay | Admitting: Family Medicine

## 2019-07-02 ENCOUNTER — Other Ambulatory Visit: Payer: Self-pay

## 2019-07-02 ENCOUNTER — Encounter (HOSPITAL_COMMUNITY): Payer: Self-pay | Admitting: *Deleted

## 2019-07-02 ENCOUNTER — Emergency Department (HOSPITAL_COMMUNITY): Payer: Self-pay

## 2019-07-02 ENCOUNTER — Emergency Department (HOSPITAL_COMMUNITY)
Admission: EM | Admit: 2019-07-02 | Discharge: 2019-07-02 | Disposition: A | Discharge status: Home / Self Care | Payer: Self-pay | Attending: Emergency Medicine | Admitting: Emergency Medicine

## 2019-07-02 DIAGNOSIS — F17210 Nicotine dependence, cigarettes, uncomplicated: Secondary | ICD-10-CM | POA: Insufficient documentation

## 2019-07-02 DIAGNOSIS — M654 Radial styloid tenosynovitis [de Quervain]: Secondary | ICD-10-CM | POA: Insufficient documentation

## 2019-07-02 MED ORDER — IBUPROFEN 800 MG PO TABS: 800.0000 mg | ORAL_TABLET | Freq: Three times a day (TID) | ORAL | 0 refills | Status: DC

## 2019-07-02 NOTE — Discharge Instructions (Addendum)
As discussed, your exam suggest an inflammation of the tendon that helps with movement of your thumb.  Wear the thumb splint as much as possible to help rest your thumb and wrist.  Apply ice is much as comfortable for the next 3 days.  You may add 20 minutes with a heating pad or warm water soak twice daily starting on Wednesday.  Use the anti-inflammatory pain medication prescribed.  Call Dr. Eulah Pont for further evaluation if this does not improve your pain symptoms.

## 2019-07-02 NOTE — ED Provider Notes (Addendum)
[facility] EMERGENCY DEPARTMENT Provider Note   CSN: [id number] Arrival date & time: 07/02/19  1214     History Chief Complaint  Patient presents with  . Wrist Pain    right    Bridget Wolf is a 24 y.o. female with a reported history of sickle cell anemia, presenting with right wrist pain and swelling which she woke with this am. Pain is localized in the right radial wrist and is worsened with flexion and movement of her thumb.  She is right handed.  She denies injury or overuse, reports history of similar symptoms in the left hand which resolved with rest and pain medication.    HPI     Past Medical History:  Diagnosis Date  . Scoliosis   . Sickle cell anemia (HCC)     There are no problems to display for this patient.   Past Surgical History:  Procedure Laterality Date  . WISDOM TOOTH EXTRACTION       OB History   No obstetric history on file.     Family History  Problem Relation Age of Onset  . Depression Mother   . Hearing loss Mother   . Mental retardation Mother   . Asthma Sister   . Hearing loss Sister   . Vision loss Sister   . Hearing loss Maternal Grandmother   . Heart disease Maternal Grandmother   . Hyperlipidemia Maternal Grandmother   . Hypertension Maternal Grandmother   . Vision loss Maternal Grandmother   . Arthritis Maternal Grandfather     Social History   Tobacco Use  . Smoking status: Current Every Day Smoker    Packs/day: 0.50    Types: Cigarettes  . Smokeless tobacco: Never Used  Vaping Use  . Vaping Use: Never used  Substance Use Topics  . Alcohol use: No    Alcohol/week: 0.0 standard drinks    Comment: social  . Drug use: No    Types: Marijuana    Comment: h/o marijuana    Home Medications Prior to Admission medications   Medication Sig Start Date End Date Taking? Authorizing Provider  ibuprofen (ADVIL) 800 MG tablet Take 1 tablet (800 mg total) by mouth 3 (three) times daily. 07/02/19   Evalee Jefferson, PA-C     Allergies    Patient has no known allergies.  Review of Systems   Review of Systems  Constitutional: Negative for fever.  Musculoskeletal: Positive for arthralgias and joint swelling. Negative for myalgias.  Neurological: Negative for weakness and numbness.    Physical Exam Updated Vital Signs BP 108/74 (BP Location: Left Arm)   Pulse 78   Temp 97.9 F (36.6 C) (Oral)   Resp 16   Ht 5\' 10"  (1.778 m)   Wt 102 kg   LMP 06/15/2019   SpO2 100%   BMI 32.27 kg/m   Physical Exam Vitals reviewed.  Constitutional:      Appearance: She is well-developed.  HENT:     Head: Atraumatic.  Cardiovascular:     Comments: Pulses equal bilaterally Musculoskeletal:        General: Tenderness present. No swelling.     Right wrist: Tenderness present. No swelling, deformity or crepitus. Decreased range of motion. Normal pulse.     Cervical back: Normal range of motion.     Comments: Tender to palpation of the right radial wrist and along the first metacarpal.  There is no appreciable edema.  She does have a positive Finkelstein test.  Distal sensation is  intact with less than 2-second cap refill in fingertips.  Skin:    General: Skin is warm and dry.  Neurological:     Mental Status: She is alert.     Sensory: No sensory deficit.     Deep Tendon Reflexes: Reflexes normal.     ED Results / Procedures / Treatments   Labs (all labs ordered are listed, but only abnormal results are displayed) Labs Reviewed - No data to display  EKG None  Radiology DG Wrist Complete Right  Result Date: 07/02/2019 CLINICAL DATA:  Wrist pain and swelling EXAM: RIGHT WRIST - COMPLETE 3+ VIEW COMPARISON:  None. FINDINGS: There is no evidence of fracture or dislocation. There is no evidence of arthropathy or other focal bone abnormality. Soft tissues are unremarkable. IMPRESSION: Negative. Electronically Signed   By: Judie Petit.  Shick M.D.   On: 07/02/2019 13:02    Procedures Procedures (including critical  care time)  Medications Ordered in ED Medications - No data to display  ED Course  I have reviewed the triage vital signs and the nursing notes.  Pertinent labs & imaging results that were available during my care of the patient were reviewed by me and considered in my medical decision making (see chart for details).    MDM Rules/Calculators/A&P                          Imaging reviewed and discussed with patient.  There is no acute abnormalities.  Her exam suggest de Quervain's tenosynovitis, of unclear etiology as she denies any overuse or acute injuries or falls.  There is no appreciable edema on my exam, there is no erythema or skin injury or lesions.  X-rays are negative for fracture or dislocation.  She is not currently working, has not in 2 years.  Review of chart indicates she has had the same diagnosis of the left wrist, completely resolved currently.  She has never been seen by an orthopedist.  She was given a referral for this.  She was placed in a thumb spica splint, discussed rest, ice, followed by heat therapy after the next several days.  Anti-inflammatories prescribed plan follow-up with the orthopedist if symptoms are not improving over the next 10 to 14 days. Final Clinical Impression(s) / ED Diagnoses Final diagnoses:  De Quervain's tenosynovitis, right    Rx / DC Orders ED Discharge Orders         Ordered    ibuprofen (ADVIL) 800 MG tablet  3 times daily     Discontinue  Reprint     07/02/19 1312           Burgess Amor, PA-C 07/02/19 1438    Burgess Amor, PA-C 07/02/19 1439    Eber Hong, MD 07/14/19 1452

## 2019-07-02 NOTE — ED Triage Notes (Signed)
Patient comes to the ED with right wrist pain and swelling.  Patient states she woke up like this and denies any injury.

## 2020-10-20 ENCOUNTER — Other Ambulatory Visit: Payer: Self-pay

## 2020-10-20 ENCOUNTER — Emergency Department (HOSPITAL_COMMUNITY)
Admission: EM | Admit: 2020-10-20 | Discharge: 2020-10-20 | Disposition: A | Discharge status: Home / Self Care | Payer: BC Managed Care – PPO | Attending: Emergency Medicine | Admitting: Emergency Medicine

## 2020-10-20 ENCOUNTER — Encounter (HOSPITAL_COMMUNITY): Payer: Self-pay | Admitting: Emergency Medicine

## 2020-10-20 ENCOUNTER — Emergency Department (HOSPITAL_COMMUNITY): Payer: BC Managed Care – PPO

## 2020-10-20 DIAGNOSIS — F17210 Nicotine dependence, cigarettes, uncomplicated: Secondary | ICD-10-CM | POA: Diagnosis not present

## 2020-10-20 DIAGNOSIS — M25562 Pain in left knee: Secondary | ICD-10-CM | POA: Diagnosis not present

## 2020-10-20 MED ORDER — IBUPROFEN 800 MG PO TABS: 800.0000 mg | ORAL_TABLET | Freq: Three times a day (TID) | ORAL | 0 refills | Status: AC

## 2020-10-20 NOTE — Discharge Instructions (Signed)
The x-ray of your knee this evening was reassuring.  The pain of your left knee is likely related to inflammation.  I recommend that you wear the knee brace when walking or standing.  You may remove it for bathing and at bedtime.  Take the ibuprofen as directed for pain.  Follow-up with the orthopedic provider listed in 1 week if not improving.

## 2020-10-20 NOTE — ED Provider Notes (Signed)
[facility] EMERGENCY DEPARTMENT Provider Note   CSN: [id number] Arrival date & time: 10/20/20  1745     History Chief Complaint  Patient presents with   Knee Pain    Bridget Wolf is a 25 y.o. female.   Knee Pain Associated symptoms: no back pain and no fever       Bridget Wolf is a 25 y.o. female with past medical history of sickle cell anemia and scoliosis who presents to the Emergency Department complaining of sudden onset of left knee pain while at work last evening.  She states that she was sitting down and when she stood up she felt a sharp pain to the medial aspect of her left knee.  No history of trauma.  Pain is associated with bending her knee and improves while standing or walking.  She denies any redness, swelling, numbness or radiating pain.  No history of prior knee injury.  Past Medical History:  Diagnosis Date   Scoliosis    Sickle cell anemia (HCC)     There are no problems to display for this patient.   Past Surgical History:  Procedure Laterality Date   WISDOM TOOTH EXTRACTION       OB History   No obstetric history on file.     Family History  Problem Relation Age of Onset   Depression Mother    Hearing loss Mother    Mental retardation Mother    Asthma Sister    Hearing loss Sister    Vision loss Sister    Hearing loss Maternal Grandmother    Heart disease Maternal Grandmother    Hyperlipidemia Maternal Grandmother    Hypertension Maternal Grandmother    Vision loss Maternal Grandmother    Arthritis Maternal Grandfather     Social History   Tobacco Use   Smoking status: Every Day    Packs/day: 0.50    Types: Cigarettes   Smokeless tobacco: Never  Vaping Use   Vaping Use: Never used  Substance Use Topics   Alcohol use: No    Alcohol/week: 0.0 standard drinks    Comment: social   Drug use: No    Types: Marijuana    Comment: h/o marijuana    Home Medications Prior to Admission medications   Medication Sig Start  Date End Date Taking? Authorizing Provider  ibuprofen (ADVIL) 800 MG tablet Take 1 tablet (800 mg total) by mouth 3 (three) times daily. 07/02/19   Burgess Amor, PA-C    Allergies    Patient has no known allergies.  Review of Systems   Review of Systems  Constitutional:  Negative for chills and fever.  Respiratory:  Negative for shortness of breath.   Cardiovascular:  Negative for chest pain and leg swelling.  Gastrointestinal:  Negative for abdominal pain, nausea and vomiting.  Musculoskeletal:  Positive for arthralgias (Left knee pain). Negative for back pain and myalgias.  Skin:  Negative for color change, rash and wound.  Neurological:  Negative for dizziness, weakness and numbness.   Physical Exam Updated Vital Signs BP 116/73 (BP Location: Right Arm)   Pulse 76   Temp 98.1 F (36.7 C) (Oral)   Resp 15   SpO2 100%   Physical Exam Vitals and nursing note reviewed.  Constitutional:      General: She is not in acute distress.    Appearance: Normal appearance.  Cardiovascular:     Rate and Rhythm: Normal rate and regular rhythm.     Pulses: Normal pulses.  Pulmonary:     Effort: Pulmonary effort is normal.  Musculoskeletal:        General: Tenderness present. No swelling or deformity.     Left knee: Crepitus present. No swelling, effusion, erythema or ecchymosis. Normal range of motion. No MCL laxity.Normal pulse.     Right lower leg: No edema.     Left lower leg: No edema.     Comments: Tenderness palpation of the medial aspect of the left knee.  There is patellar crepitus noted.  No bony deformities or high riding patella.  No pain with valgus or varus stress.    Skin:    General: Skin is warm.     Capillary Refill: Capillary refill takes less than 2 seconds.     Findings: No bruising, erythema or rash.  Neurological:     General: No focal deficit present.     Mental Status: She is alert.     Sensory: No sensory deficit.     Motor: No weakness.    ED Results /  Procedures / Treatments   Labs (all labs ordered are listed, but only abnormal results are displayed) Labs Reviewed - No data to display  EKG None  Radiology DG Knee Complete 4 Views Left  Result Date: 10/20/2020 CLINICAL DATA:  Left knee pain.  No acute injury. EXAM: LEFT KNEE - COMPLETE 4+ VIEW COMPARISON:  None. FINDINGS: No evidence of fracture, dislocation, or joint effusion. No evidence of arthropathy or other focal bone abnormality. Soft tissues are unremarkable. IMPRESSION: Negative. Electronically Signed   By: Romona Curls M.D.   On: 10/20/2020 18:48    Procedures Procedures   Medications Ordered in ED Medications - No data to display  ED Course  I have reviewed the triage vital signs and the nursing notes.  Pertinent labs & imaging results that were available during my care of the patient were reviewed by me and considered in my medical decision making (see chart for details).    MDM Rules/Calculators/A&P                           Pt here with pain to medial left knee w/o known injury.  Pain began after standing from a seated position.  No history of prior left knee pain.  No recent illness, fever or chills.  On exam, patient has focal tenderness of the medial aspect of the patella.  There is patellar crepitus throughout range of motion.  X-ray of the knee without acute bony findings.  No concerning symptoms for septic joint.  Patient well-appearing, vital signs reassuring. Likely inflammatory process.  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  Knee brace applied.   Final Clinical Impression(s) / ED Diagnoses Final diagnoses:  Acute pain of left knee    Rx / DC Orders ED Discharge Orders     None        Rosey Bath 10/20/20 1953    Eber Hong, MD 10/21/20 1737

## 2020-10-20 NOTE — ED Triage Notes (Signed)
Pt reports left knee pain that started while at work today. Pt denies injury to the knee. Reports the pain is worse when bending the knee.

## 2023-09-30 IMAGING — DX DG KNEE COMPLETE 4+V*L*
4 series · 4 of 4 positions shown · non-contrast
Comparison: None.

CLINICAL DATA: Left knee pain.  No acute injury.

EXAM:
LEFT KNEE - COMPLETE 4+ VIEW

[knee ap]
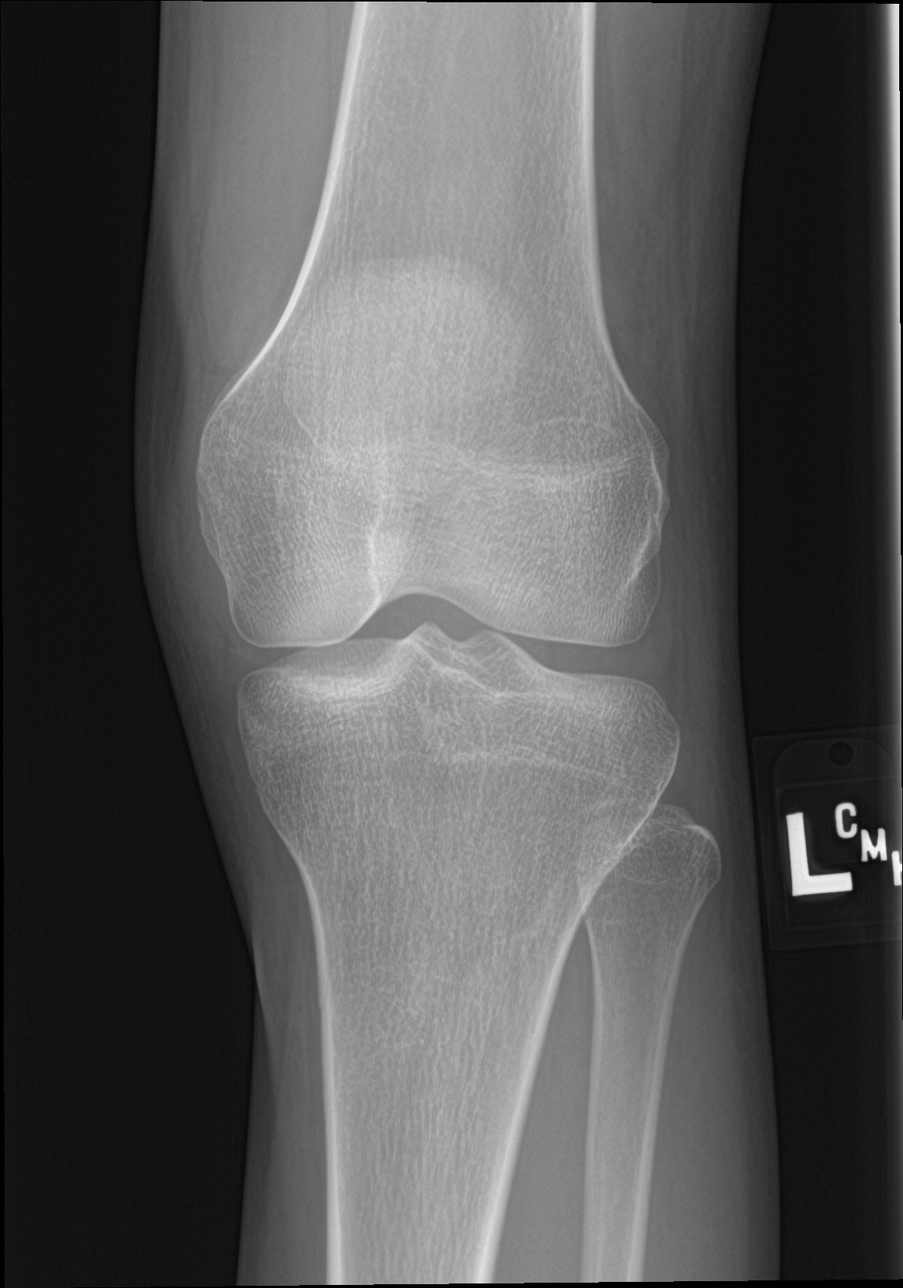

[knee obl (1 of 2)]
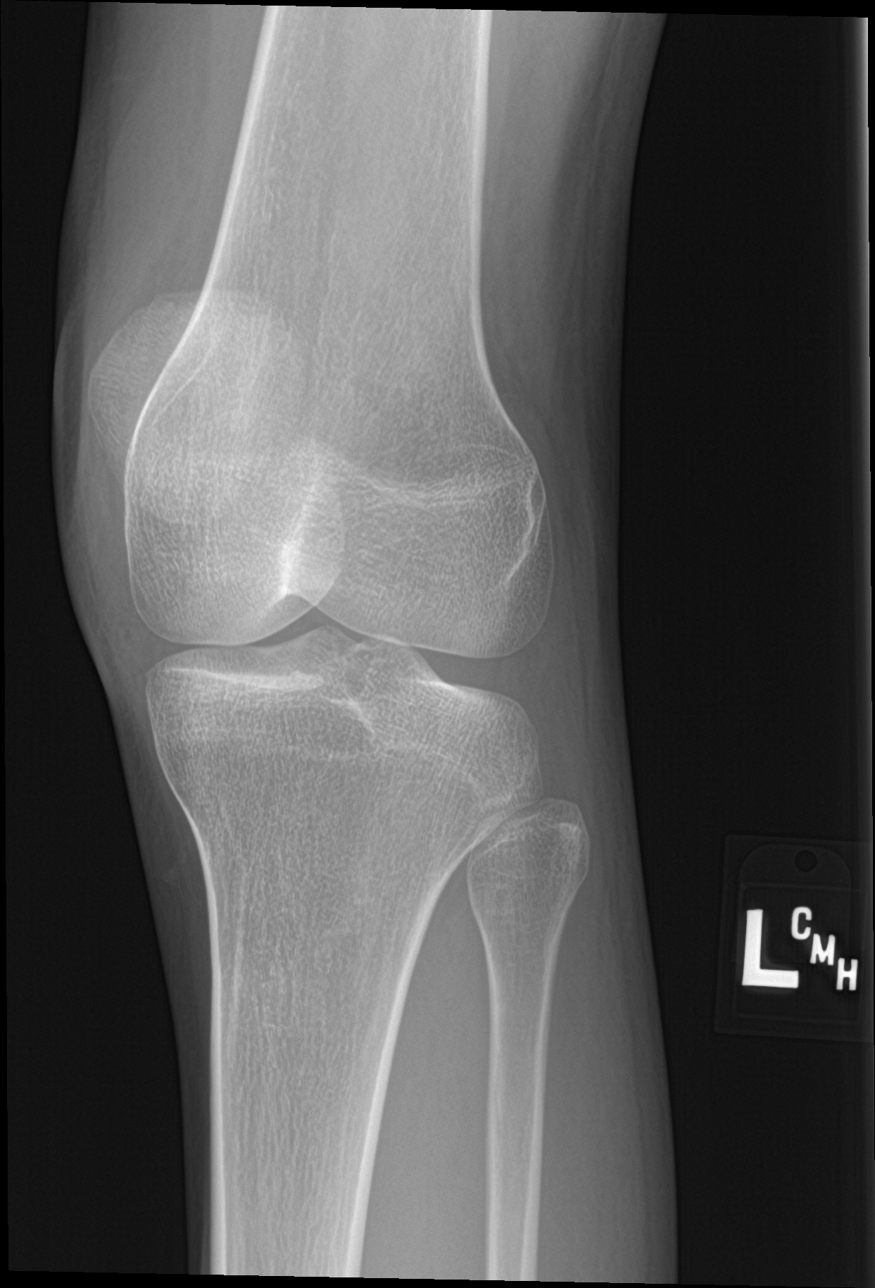

[knee obl (2 of 2)]
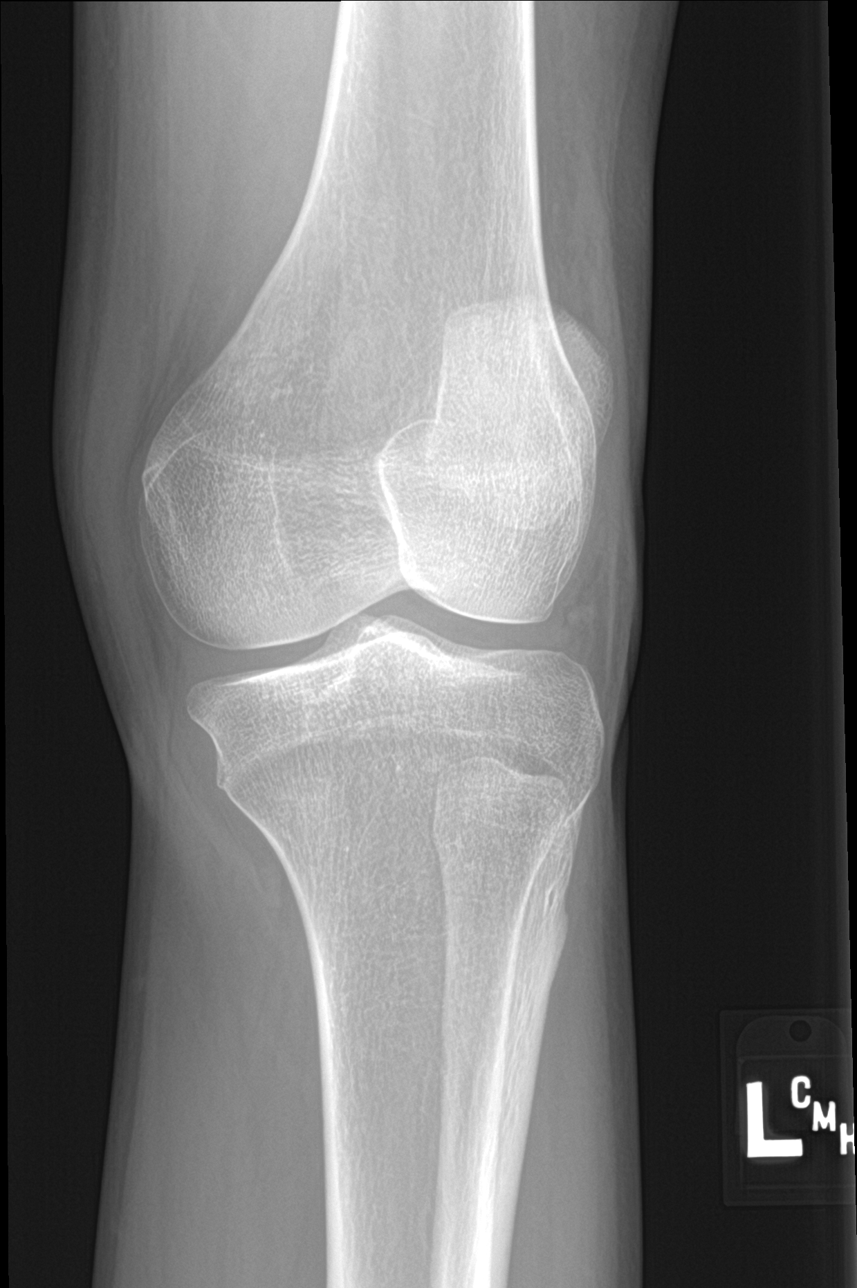

[knee lat]
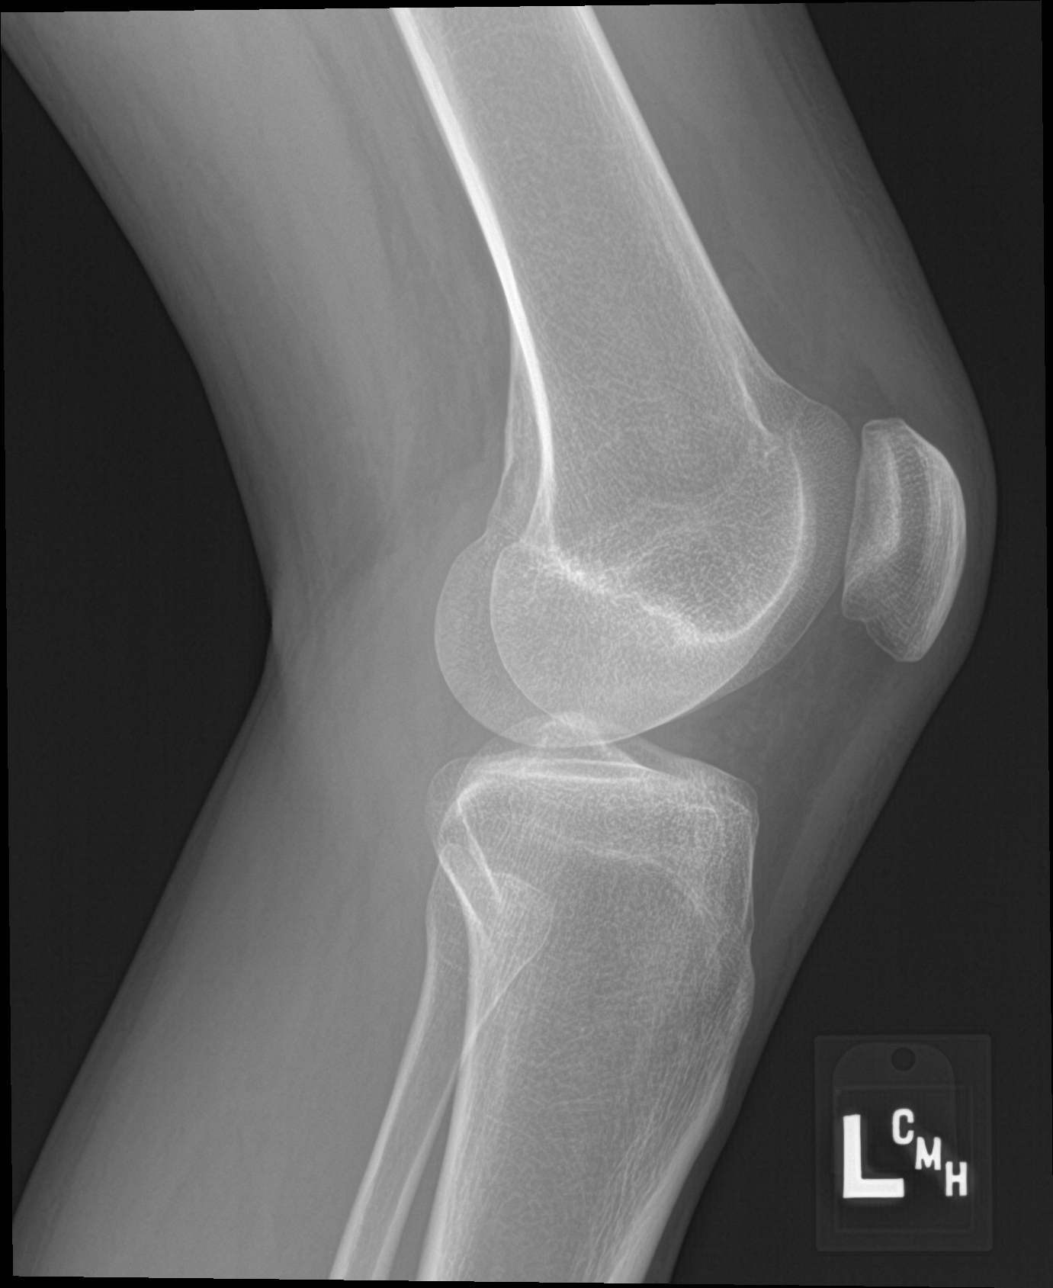

[4 of 4 positions shown; findings below may reference images not displayed]

FINDINGS: No evidence of fracture, dislocation, or joint effusion. No evidence
of arthropathy or other focal bone abnormality. Soft tissues are
unremarkable.
IMPRESSION: Negative.
